# Patient Record
Sex: Female | Born: 1970 | Race: White | Hispanic: No | Marital: Married | Smoking: Never smoker
Health system: Southern US, Community
[De-identification: ages and names within clinical notes are randomized; demographics above are authoritative.]

## PROBLEM LIST (undated history)

## (undated) DIAGNOSIS — N979 Female infertility, unspecified: Secondary | ICD-10-CM

## (undated) DIAGNOSIS — F909 Attention-deficit hyperactivity disorder, unspecified type: Secondary | ICD-10-CM

## (undated) HISTORY — PX: FOOT SURGERY: SHX648

---

## 1998-06-12 ENCOUNTER — Inpatient Hospital Stay (HOSPITAL_COMMUNITY): Admission: AD | Admit: 1998-06-12 | Discharge: 1998-06-16 | Payer: Self-pay | Admitting: Obstetrics and Gynecology

## 1998-07-22 ENCOUNTER — Other Ambulatory Visit: Admission: RE | Admit: 1998-07-22 | Discharge: 1998-07-22 | Payer: Self-pay | Admitting: Obstetrics and Gynecology

## 1999-07-30 ENCOUNTER — Other Ambulatory Visit: Admission: RE | Admit: 1999-07-30 | Discharge: 1999-07-30 | Payer: Self-pay | Admitting: Obstetrics and Gynecology

## 2000-08-09 ENCOUNTER — Other Ambulatory Visit: Admission: RE | Admit: 2000-08-09 | Discharge: 2000-08-09 | Payer: Self-pay | Admitting: Obstetrics and Gynecology

## 2001-08-15 ENCOUNTER — Other Ambulatory Visit: Admission: RE | Admit: 2001-08-15 | Discharge: 2001-08-15 | Payer: Self-pay | Admitting: Obstetrics and Gynecology

## 2002-08-20 ENCOUNTER — Other Ambulatory Visit: Admission: RE | Admit: 2002-08-20 | Discharge: 2002-08-20 | Payer: Self-pay | Admitting: Obstetrics and Gynecology

## 2003-04-01 ENCOUNTER — Other Ambulatory Visit: Admission: RE | Admit: 2003-04-01 | Discharge: 2003-04-01 | Payer: Self-pay | Admitting: Obstetrics and Gynecology

## 2003-10-05 HISTORY — PX: TUBAL LIGATION: SHX77

## 2003-10-20 ENCOUNTER — Inpatient Hospital Stay (HOSPITAL_COMMUNITY): Admission: AD | Admit: 2003-10-20 | Discharge: 2003-10-20 | Payer: Self-pay | Admitting: Obstetrics and Gynecology

## 2003-10-26 ENCOUNTER — Encounter (INDEPENDENT_AMBULATORY_CARE_PROVIDER_SITE_OTHER): Payer: Self-pay | Admitting: *Deleted

## 2003-10-26 ENCOUNTER — Inpatient Hospital Stay (HOSPITAL_COMMUNITY): Admission: AD | Admit: 2003-10-26 | Discharge: 2003-10-28 | Payer: Self-pay | Admitting: Obstetrics and Gynecology

## 2003-12-04 ENCOUNTER — Other Ambulatory Visit: Admission: RE | Admit: 2003-12-04 | Discharge: 2003-12-04 | Payer: Self-pay | Admitting: Obstetrics and Gynecology

## 2005-03-18 ENCOUNTER — Other Ambulatory Visit: Admission: RE | Admit: 2005-03-18 | Discharge: 2005-03-18 | Payer: Self-pay | Admitting: Obstetrics and Gynecology

## 2009-10-04 HISTORY — PX: MICROTUBOPLASTY: SHX5401

## 2010-02-25 ENCOUNTER — Ambulatory Visit (HOSPITAL_COMMUNITY): Admission: RE | Admit: 2010-02-25 | Discharge: 2010-02-25 | Payer: Self-pay | Admitting: Specialist

## 2010-05-29 ENCOUNTER — Ambulatory Visit (HOSPITAL_COMMUNITY): Admission: RE | Admit: 2010-05-29 | Discharge: 2010-05-29 | Payer: Self-pay | Admitting: Specialist

## 2010-12-07 ENCOUNTER — Other Ambulatory Visit: Payer: Self-pay | Admitting: Obstetrics and Gynecology

## 2011-02-19 NOTE — Op Note (Signed)
Martha, Clayton                          ACCOUNT NO.:  [id number]   MEDICAL RECORD NO.:  [id number]                   PATIENT TYPE:  INP   LOCATION:  9108                                 FACILITY:  [facility]   PHYSICIAN:  Dineen Kid. Rana Snare, M.D.                 DATE OF BIRTH:  1970/12/25   DATE OF PROCEDURE:  10/26/2003  DATE OF DISCHARGE:                                 OPERATIVE REPORT   PREOPERATIVE DIAGNOSIS:  Multiparity and desires sterility.   POSTOPERATIVE DIAGNOSIS:  Multiparity and desires sterility.   PROCEDURE:  Modified [place] bilateral tubal ligation.   SURGEON:  Dineen Kid. Rana Snare, M.D.   ANESTHESIA:  Epidural.   INDICATIONS:  [name] is a 40 year old, G2, P2, status post vaginal  delivery of her second child without complications.  She and her husband  adamantly desire sterilization.  Risks and benefits were discussed at length  which include but not limited to risk of infection; bleeding; damage to  uterus, tubes, ovaries, bowel, bladder, risk of tubal failure quoted as  5:1000 failure rate.  She gives her informed consent.   DESCRIPTION OF PROCEDURE:  After adequate analgesia, the patient was placed  in the supine position.  She is sterilely prepped and draped.  A 1.5 cm  infraumbilical skin incision was made.  This was taken down sharply to the  fascia which is entered sharply.  Peritoneum is also entered sharply and  retracted laterally with [employer] retractors.  The left fallopian tube was  identified, grasped with a Babcock clamp, confirmed by the fimbriated end.  A mid portion of the tube was grasped, doubly ligated with 0 plain suture.  The proximal portion was then ligated with 2-0 silk and the central portion  excised.  It was noted to be hemostatic and re-placed back in the peritoneal  cavity.  The right fallopian tube was grasped, confirmed by the fimbriated  end, and the end portion of the tube was doubly ligated with 0 plain suture  x 2.  The proximal  section was then ligated with 2-0 silk and the central  portion excised with good hemostasis noted.  It was returned back to the  peritoneal cavity.  The fascia was then closed with 0 Vicryl in a running  fashion.  Skin was then closed with 3-0 Vicryl Rapide in subcuticular  fashion.  The incision was injected with 0.25% Marcaine 5 mL used.  The  patient tolerated the procedure well, was stable on transfer to the recovery  room.  Sponge, needle, and instrument counts were correct was normal x 3.  The estimated blood loss was less than 5 mL.  Dineen Kid Rana Snare, M.D.    DCL/MEDQ  D:  10/26/2003  T:  10/26/2003  Job:  [id number]

## 2011-09-03 ENCOUNTER — Encounter (HOSPITAL_COMMUNITY): Payer: Self-pay | Admitting: Pharmacy Technician

## 2011-09-04 ENCOUNTER — Other Ambulatory Visit: Payer: Self-pay | Admitting: Obstetrics and Gynecology

## 2011-09-04 ENCOUNTER — Ambulatory Visit (HOSPITAL_COMMUNITY)
Admission: RE | Admit: 2011-09-04 | Discharge: 2011-09-04 | Disposition: A | Discharge status: Home / Self Care | Payer: BC Managed Care – PPO | Source: Ambulatory Visit | Attending: Obstetrics and Gynecology | Admitting: Obstetrics and Gynecology

## 2011-09-04 ENCOUNTER — Encounter (HOSPITAL_COMMUNITY)
Admission: RE | Disposition: A | Discharge status: Home / Self Care | Payer: Self-pay | Source: Ambulatory Visit | Attending: Obstetrics and Gynecology

## 2011-09-04 ENCOUNTER — Encounter (HOSPITAL_COMMUNITY): Payer: Self-pay | Admitting: Anesthesiology

## 2011-09-04 ENCOUNTER — Encounter (HOSPITAL_COMMUNITY): Payer: Self-pay | Admitting: *Deleted

## 2011-09-04 ENCOUNTER — Ambulatory Visit (HOSPITAL_COMMUNITY): Payer: BC Managed Care – PPO | Admitting: Anesthesiology

## 2011-09-04 DIAGNOSIS — O021 Missed abortion: Secondary | ICD-10-CM

## 2011-09-04 HISTORY — PX: DILATION AND EVACUATION: SHX1459

## 2011-09-04 LAB — CBC
Hemoglobin: 11.1 g/dL — ABNORMAL LOW (ref 12.0–15.0)
MCV: 96.2 fL (ref 78.0–100.0)
Platelets: 226 10*3/uL (ref 150–400)

## 2011-09-04 SURGERY — DILATION AND EVACUATION, UTERUS
Anesthesia: Monitor Anesthesia Care

## 2011-09-04 MED ORDER — DEXAMETHASONE SODIUM PHOSPHATE 10 MG/ML IJ SOLN
INTRAMUSCULAR | Status: DC | PRN
  Administered 2011-09-04: 10 mg via INTRAVENOUS

## 2011-09-04 MED ORDER — FENTANYL CITRATE 0.05 MG/ML IJ SOLN
INTRAMUSCULAR | Status: DC | PRN
  Administered 2011-09-04: 50 ug via INTRAVENOUS
  Administered 2011-09-04 (×2): 100 ug via INTRAVENOUS

## 2011-09-04 MED ORDER — FENTANYL CITRATE 0.05 MG/ML IJ SOLN
INTRAMUSCULAR | Status: AC
  Filled 2011-09-04: qty 5

## 2011-09-04 MED ORDER — PROPOFOL 10 MG/ML IV EMUL
INTRAVENOUS | Status: DC | PRN
  Administered 2011-09-04: 50 mg via INTRAVENOUS

## 2011-09-04 MED ORDER — METHYLERGONOVINE MALEATE 0.2 MG PO TABS: 0.2000 mg | ORAL_TABLET | Freq: Three times a day (TID) | ORAL | Status: AC

## 2011-09-04 MED ORDER — ONDANSETRON HCL 4 MG/2ML IJ SOLN
INTRAMUSCULAR | Status: DC | PRN
  Administered 2011-09-04: 4 mg via INTRAVENOUS

## 2011-09-04 MED ORDER — LIDOCAINE HCL (CARDIAC) 20 MG/ML IV SOLN
INTRAVENOUS | Status: AC
  Filled 2011-09-04: qty 5

## 2011-09-04 MED ORDER — KETOROLAC TROMETHAMINE 30 MG/ML IJ SOLN
INTRAMUSCULAR | Status: AC
  Filled 2011-09-04: qty 1

## 2011-09-04 MED ORDER — LACTATED RINGERS IV SOLN
INTRAVENOUS | Status: DC
  Administered 2011-09-04: 08:00:00 via INTRAVENOUS

## 2011-09-04 MED ORDER — METHYLERGONOVINE MALEATE 0.2 MG/ML IJ SOLN
INTRAMUSCULAR | Status: AC
  Filled 2011-09-04: qty 1

## 2011-09-04 MED ORDER — CEFOTETAN DISODIUM-DEXTROSE 1-3.58 GM-% IV SOLR
1.0000 g | INTRAVENOUS | Status: AC
  Administered 2011-09-04: 1 g via INTRAVENOUS
  Filled 2011-09-04: qty 50

## 2011-09-04 MED ORDER — PROPOFOL 10 MG/ML IV EMUL
INTRAVENOUS | Status: AC
  Filled 2011-09-04: qty 20

## 2011-09-04 MED ORDER — METHYLERGONOVINE MALEATE 0.2 MG/ML IJ SOLN
INTRAMUSCULAR | Status: DC | PRN
  Administered 2011-09-04: 0.2 mg via INTRAMUSCULAR

## 2011-09-04 MED ORDER — MIDAZOLAM HCL 2 MG/2ML IJ SOLN
INTRAMUSCULAR | Status: AC
  Filled 2011-09-04: qty 2

## 2011-09-04 MED ORDER — ONDANSETRON HCL 4 MG/2ML IJ SOLN
INTRAMUSCULAR | Status: AC
  Filled 2011-09-04: qty 2

## 2011-09-04 MED ORDER — KETOROLAC TROMETHAMINE 30 MG/ML IJ SOLN
INTRAMUSCULAR | Status: DC | PRN
  Administered 2011-09-04: 30 mg via INTRAVENOUS

## 2011-09-04 MED ORDER — MIDAZOLAM HCL 5 MG/5ML IJ SOLN
INTRAMUSCULAR | Status: DC | PRN
  Administered 2011-09-04: 2 mg via INTRAVENOUS

## 2011-09-04 MED ORDER — DEXAMETHASONE SODIUM PHOSPHATE 10 MG/ML IJ SOLN
INTRAMUSCULAR | Status: AC
  Filled 2011-09-04: qty 1

## 2011-09-04 MED ORDER — LIDOCAINE HCL (CARDIAC) 20 MG/ML IV SOLN
INTRAVENOUS | Status: DC | PRN
  Administered 2011-09-04: 40 mg via INTRAVENOUS

## 2011-09-04 MED ORDER — LIDOCAINE-EPINEPHRINE 1 %-1:100000 IJ SOLN
INTRAMUSCULAR | Status: DC | PRN
  Administered 2011-09-04: 20 mL via INTRADERMAL

## 2011-09-04 SURGICAL SUPPLY — 19 items
CATH ROBINSON RED A/P 16FR (CATHETERS) ×2 IMPLANT
CLOTH BEACON ORANGE TIMEOUT ST (SAFETY) ×2 IMPLANT
DECANTER SPIKE VIAL GLASS SM (MISCELLANEOUS) ×2 IMPLANT
GLOVE BIO SURGEON STRL SZ8 (GLOVE) ×2 IMPLANT
GLOVE SURG ORTHO 8.0 STRL STRW (GLOVE) ×2 IMPLANT
GLOVE SURG SS PI 7.5 STRL IVOR (GLOVE) ×6 IMPLANT
GOWN PREVENTION PLUS LG XLONG (DISPOSABLE) ×2 IMPLANT
KIT BERKELEY 1ST TRIMESTER 3/8 (MISCELLANEOUS) ×2 IMPLANT
NEEDLE SPNL 22GX3.5 QUINCKE BK (NEEDLE) ×2 IMPLANT
NS IRRIG 1000ML POUR BTL (IV SOLUTION) ×2 IMPLANT
PACK VAGINAL MINOR WOMEN LF (CUSTOM PROCEDURE TRAY) ×2 IMPLANT
PAD PREP 24X48 CUFFED NSTRL (MISCELLANEOUS) ×2 IMPLANT
SET BERKELEY SUCTION TUBING (SUCTIONS) ×2 IMPLANT
SYR CONTROL 10ML LL (SYRINGE) ×2 IMPLANT
TOWEL OR 17X24 6PK STRL BLUE (TOWEL DISPOSABLE) ×4 IMPLANT
VACURETTE 10 RIGID CVD (CANNULA) ×2 IMPLANT
VACURETTE 7MM CVD STRL WRAP (CANNULA) IMPLANT
VACURETTE 8 RIGID CVD (CANNULA) IMPLANT
VACURETTE 9 RIGID CVD (CANNULA) IMPLANT

## 2011-09-04 NOTE — Op Note (Signed)
NAMEDAVANEE, KLINKNER                   ACCOUNT NO.:  [id number]  MEDICAL RECORD NO.:  [id number]  LOCATION:  [facility]                          FACILITY:  [facility]  PHYSICIAN:  Dineen Kid. Rana Snare, M.D.    DATE OF BIRTH:  13-Apr-1971  DATE OF PROCEDURE:  09/04/2011 DATE OF DISCHARGE:  09/04/2011                              OPERATIVE REPORT   PREOPERATIVE DIAGNOSIS:  Intrauterine pregnancy at 10+ gestational weeks with embryonic demise.  POSTOPERATIVE DIAGNOSIS:  Intrauterine pregnancy at 10+ gestational weeks with embryonic demise.  PROCEDURE:  Dilation and evacuation.  SURGEON:  Dineen Kid. Rana Snare, MD  ANESTHESIA:  Monitored anesthetic care and paracervical block.  INDICATIONS:  Ms. Netherland is a 40 year old, she is a G4, P2, A1, who was seen in the office yesterday for a routine OB visit with pregnancy is a product of in vitro fertilization.  Yesterday on the evening, I hear her fetal heart tone and ultrasound evaluation shows a 10-week embryonic demise.  She desires dilation and evacuation.  Risks and benefits were discussed.  Informed consent was obtained.  She is Rh positive.  DESCRIPTION OF PROCEDURE:  After adequate analgesia, the patient was placed in the dorsal lithotomy position.  She was sterilely prepped and draped.  Bladder was sterilely drained.  Graves speculum was placed. Tenaculum was placed in the anterior lip of the cervix.  Paracervical block was placed with 1% Xylocaine 1:100,000 epinephrine, total of 20 mL used.  Uterus was sounded to 11 cm, easily dilated to #31 [facility] dilator. A 10-mm suction curette was inserted.  Products of conception retrieved. Suction curettage was performed until all the tissue was removed from the endometrial cavity.  The patient was then given Methergine 0.2 mg IM with good uterine response.  Gritty surface was felt throughout the endometrial cavity and good uterine response was noted with minimal bleeding or tissue and at this time, the curette was then  removed.  The tenaculum was removed from the anterior lip of the cervix.  The speculum was removed.  The patient was transferred to the recovery room in stable condition.  Sponge and instrument count was normal x3.  Estimated blood loss was minimal.  The patient received 1 gram of cefotetan preoperatively, 0.2 mg of Methergine intraoperatively, and 30 mg of Toradol intravenously intraoperatively.  DISPOSITION:  The patient will be discharged to home and follow up in the office in 2-3 weeks.  Sent her with routine instruction sheet for D and E, also Methergine 0.2 mg take 3 times a day for 2 days.  Told to return for increased pain, fever, or bleeding.     Dineen Kid Rana Snare, M.D.     DCL/MEDQ  D:  09/04/2011  T:  09/04/2011  Job:  [id number]

## 2011-09-04 NOTE — Op Note (Signed)
Dictated Op Note D&E for Embryonic demise 10 weeks Para Cx, MAC Garhett Bernhard No complications DL

## 2011-09-04 NOTE — Progress Notes (Signed)
Pt has perip-pad with scant blood noted. Will continue to monitor.

## 2011-09-04 NOTE — Transfer of Care (Signed)
Immediate Anesthesia Transfer of Care Note  Patient: Martha Clayton  Procedure(s) Performed:  DILATATION AND EVACUATION (D&E)  Patient Location: PACU  Anesthesia Type: MAC  Level of Consciousness: oriented and sedated  Airway & Oxygen Therapy: Patient Spontanous Breathing  Post-op Assessment: Report given to PACU RN and Post -op Vital signs reviewed and stable  Post vital signs: stable  Complications: No apparent anesthesia complications

## 2011-09-04 NOTE — H&P (Signed)
Martha Clayton is an 40 y.o. female. Who presents for D&E due to embryonic demise.  Seen yesterday in office for routine new ob visit U/S confirms 10week  Embryonic demise.  This was an IVF pregnancy and otherwise uncomplicated  Pertinent Gynecological History: Menses:  Bleeding: Contraception: none DES exposure: denies Blood transfusions: none Sexually transmitted diseases: no past history Previous GYN Procedures:   Last mammogram: normal Date:  Last pap: normal Date:  OB History: G4, P2   Menstrual History: Menarche age:  No LMP recorded.    History reviewed. No pertinent past medical history.  Past Surgical History  Procedure Date  . Foot surgery 2010, 2006  . Microtuboplasty 2011  . Tubal ligation 2005    History reviewed. No pertinent family history.  Social History:  does not have a smoking history on file. She does not have any smokeless tobacco history on file. She reports that she does not drink alcohol or use illicit drugs.  Allergies: No Known Allergies  Prescriptions prior to admission  Medication Sig Dispense Refill  . docusate sodium (COLACE) 100 MG capsule Take 100 mg by mouth daily as needed. bm       . prenatal vitamin w/FE, FA (PRENATAL 1 + 1) 27-1 MG TABS Take 1 tablet by mouth daily.          ROS  Blood pressure 105/66, pulse 69, temperature 99 F (37.2 C), temperature source Oral, resp. rate 18, height 5\' 7"  (1.702 m), weight 67.132 kg (148 lb), SpO2 100.00%. Physical Exam Uterus is 10 weeks size Cx Closed and thick Results for orders placed during the hospital encounter of 09/04/11 (from the past 24 hour(s))  CBC     Status: Abnormal   Collection Time   09/04/11  8:00 AM      Component Value Range   WBC 7.6  4.0 - 10.5 (K/uL)   RBC 3.38 (*) 3.87 - 5.11 (MIL/uL)   Hemoglobin 11.1 (*) 12.0 - 15.0 (g/dL)   HCT 16.1 (*) 09.6 - 46.0 (%)   MCV 96.2  78.0 - 100.0 (fL)   MCH 32.8  26.0 - 34.0 (pg)   MCHC 34.2  30.0 - 36.0 (g/dL)   RDW 04.5  40.9 -  81.1 (%)   Platelets 226  150 - 400 (K/uL)  ABO/RH     Status: Normal   Collection Time   09/04/11  8:00 AM      Component Value Range   ABO/RH(D) O POS      No results found.  Assessment/Plan: IUP at 10 weeks with embryonic demise Pt elects for D&E.  Risks and benefits were discussed and informed consent obtained.   Maliq Pilley C 09/04/2011, 9:03 AM

## 2011-09-04 NOTE — Anesthesia Postprocedure Evaluation (Signed)
Anesthesia Post Note  Patient: Martha Clayton  Procedure(s) Performed:  DILATATION AND EVACUATION (D&E)  Anesthesia type: MAC  Patient location: PACU  Post pain: Pain level controlled  Post assessment: Post-op Vital signs reviewed  Last Vitals:  Filed Vitals:   09/04/11 1230  BP: 104/60  Pulse: 80  Temp: 36.4 C  Resp: 18    Post vital signs: Reviewed  Level of consciousness: sedated  Complications: No apparent anesthesia complications

## 2011-09-04 NOTE — Anesthesia Preprocedure Evaluation (Signed)
Anesthesia Evaluation  Patient identified by MRN, date of birth, ID band Patient awake    Reviewed: Allergy & Precautions, H&P , NPO status , Patient's Chart, lab work & pertinent test results, reviewed documented beta blocker date and time   History of Anesthesia Complications Negative for: history of anesthetic complications  Airway Mallampati: III TM Distance: >3 FB Neck ROM: full    Dental  (+) Teeth Intact   Pulmonary neg pulmonary ROS,  clear to auscultation  Pulmonary exam normal       Cardiovascular neg cardio ROS regular Normal    Neuro/Psych Negative Neurological ROS  Negative Psych ROS   GI/Hepatic negative GI ROS, Neg liver ROS,   Endo/Other  Negative Endocrine ROS  Renal/GU negative Renal ROS  Genitourinary negative   Musculoskeletal   Abdominal   Peds  Hematology negative hematology ROS (+)   Anesthesia Other Findings   Reproductive/Obstetrics (+) Pregnancy (10 wk IVF pregnancy - missed ab)                           Anesthesia Physical Anesthesia Plan  ASA: II  Anesthesia Plan: MAC   Post-op Pain Management:    Induction:   Airway Management Planned:   Additional Equipment:   Intra-op Plan:   Post-operative Plan:   Informed Consent: I have reviewed the patients History and Physical, chart, labs and discussed the procedure including the risks, benefits and alternatives for the proposed anesthesia with the patient or authorized representative who has indicated his/her understanding and acceptance.   Dental Advisory Given  Plan Discussed with: CRNA and Surgeon  Anesthesia Plan Comments:         Anesthesia Quick Evaluation

## 2011-09-07 ENCOUNTER — Encounter (HOSPITAL_COMMUNITY): Payer: Self-pay | Admitting: Obstetrics and Gynecology

## 2012-03-14 ENCOUNTER — Emergency Department
Admit: 2012-03-14 | Discharge: 2012-03-14 | Disposition: A | Discharge status: Home / Self Care | Payer: BC Managed Care – PPO

## 2012-03-14 ENCOUNTER — Encounter: Payer: Self-pay | Admitting: Emergency Medicine

## 2012-03-14 ENCOUNTER — Emergency Department
Admission: EM | Admit: 2012-03-14 | Discharge: 2012-03-14 | Disposition: A | Discharge status: Home / Self Care | Payer: BC Managed Care – PPO | Source: Home / Self Care | Attending: Family Medicine | Admitting: Family Medicine

## 2012-03-14 DIAGNOSIS — S93409A Sprain of unspecified ligament of unspecified ankle, initial encounter: Secondary | ICD-10-CM

## 2012-03-14 DIAGNOSIS — S93401A Sprain of unspecified ligament of right ankle, initial encounter: Secondary | ICD-10-CM

## 2012-03-14 HISTORY — DX: Female infertility, unspecified: N97.9

## 2012-03-14 HISTORY — DX: Attention-deficit hyperactivity disorder, unspecified type: F90.9

## 2012-03-14 NOTE — ED Notes (Signed)
Missed a step and rolled right ankle 3 days ago; seemed to be improving, then regressed to more pain and edema today.

## 2012-03-14 NOTE — Discharge Instructions (Signed)
Apply ice pack for 30 minutes, 3 to 4 times daily until swelling decreases.  Elevate.  Use crutches for 3 to 5 days.  Wear Ace wrap until swelling decreases.  Wear brace for about 2 to 3 weeks.  Begin range of motion and stretching exercises in about 5 days as per instruction sheet.   Ankle Sprain An ankle sprain is an injury to the strong, fibrous tissues (ligaments) that hold the bones of your ankle joint together.  CAUSES Ankle sprain usually is caused by a fall or by twisting your ankle. People who participate in sports are more prone to these types of injuries.  SYMPTOMS  Symptoms of ankle sprain include:  Pain in your ankle. The pain may be present at rest or only when you are trying to stand or walk.   Swelling.   Bruising. Bruising may develop immediately or within 1 to 2 days after your injury.   Difficulty standing or walking.  DIAGNOSIS  Your caregiver will ask you details about your injury and perform a physical exam of your ankle to determine if you have an ankle sprain. During the physical exam, your caregiver will press and squeeze specific areas of your foot and ankle. Your caregiver will try to move your ankle in certain ways. An X-ray exam may be done to be sure a bone was not broken or a ligament did not separate from one of the bones in your ankle (avulsion).  TREATMENT  Certain types of braces can help stabilize your ankle. Your caregiver can make a recommendation for this. Your caregiver may recommend the use of medication for pain. If your sprain is severe, your caregiver may refer you to a surgeon who helps to restore function to parts of your skeletal system (orthopedist) or a physical therapist. HOME CARE INSTRUCTIONS  Apply ice to your injury for 1 to 2 days or as directed by your caregiver. Applying ice helps to reduce inflammation and pain.  Put ice in a plastic bag.   Place a towel between your skin and the bag.   Leave the ice on for 15 to 20 minutes at a  time, every 2 hours while you are awake.   Take over-the-counter or prescription medicines for pain, discomfort, or fever only as directed by your caregiver.   Keep your injured leg elevated, when possible, to lessen swelling.   If your caregiver recommends crutches, use them as instructed. Gradually, put weight on the affected ankle. Continue to use crutches or a cane until you can walk without feeling pain in your ankle.   If you have a plaster splint, wear the splint as directed by your caregiver. Do not rest it on anything harder than a pillow the first 24 hours. Do not put weight on it. Do not get it wet. You may take it off to take a shower or bath.   You may have been given an elastic bandage to wear around your ankle to provide support. If the elastic bandage is too tight (you have numbness or tingling in your foot or your foot becomes cold and blue), adjust the bandage to make it comfortable.   If you have an air splint, you may blow more air into it or let air out to make it more comfortable. You may take your splint off at night and before taking a shower or bath.   Wiggle your toes in the splint several times per day if you are able.  SEEK MEDICAL CARE IF:  You have an increase in bruising, swelling, or pain.   Your toes feel cold.   Pain relief is not achieved with medication.  SEEK IMMEDIATE MEDICAL CARE IF: Your toes are numb or blue or you have severe pain. MAKE SURE YOU:   Understand these instructions.   Will watch your condition.   Will get help right away if you are not doing well or get worse.  Document Released: 09/20/2005 Document Revised: 09/09/2011 Document Reviewed: 04/24/2008 [facility] Patient Information 2012 [place], [place].

## 2012-03-14 NOTE — ED Provider Notes (Signed)
History     CSN: [id number]  Arrival date & time 03/14/12  1749   First MD Initiated Contact with Patient 03/14/12 1804      Chief Complaint  Patient presents with  . Ankle Pain     HPI Comments: Patient inverted her right ankle and felt a popping sensation laterally two days ago.  She has had persistent pain/swelling with weight bearing.  Patient is a 41 y.o. female presenting with ankle pain. The history is provided by the patient.  Ankle Pain This is a new problem. The current episode started 2 days ago. The problem occurs constantly. The problem has not changed since onset.Associated symptoms comments: none. The symptoms are aggravated by walking and standing. The symptoms are relieved by nothing. Treatments tried: ice. The treatment provided no relief.    Past medical history:  ADHD  Past Surgical History  Procedure Date  . Foot surgery 2010, 2006  . Microtuboplasty 2011  . Tubal ligation 2005  . Dilation and evacuation 09/04/2011    Procedure: DILATATION AND EVACUATION (D&E);  Surgeon: Turner Daniels, MD;  Location: [facility];  Service: Gynecology;  Laterality: N/A;    Family history:  Hypertension (mother and father)  History  Substance Use Topics  . Smoking status: No   . Smokeless tobacco: No   . Alcohol Use: No    OB History    Grav Para Term Preterm Abortions TAB SAB Ect Mult Living                  Review of Systems  All other systems reviewed and are negative.    Allergies  Review of patient's allergies indicates no known allergies.  Home Medications   Current Outpatient Rx  Name Route Sig Dispense Refill  . DOCUSATE SODIUM 100 MG PO CAPS Oral Take 100 mg by mouth daily as needed. bm     . METHYLERGONOVINE MALEATE 0.2 MG PO TABS Oral Take 1 tablet (0.2 mg total) by mouth 3 (three) times daily. 6 tablet 0  . PRENATAL PLUS 27-1 MG PO TABS Oral Take 1 tablet by mouth daily.        BP 111/73  Pulse 67  Temp(Src) 98.1 F (36.7 C) (Oral)  Resp 16   Ht 5\' 7"  (1.702 m)  Wt 145 lb (65.772 kg)  BMI 22.71 kg/m2  SpO2 100%  LMP 02/06/2012  Physical Exam  Nursing note and vitals reviewed. Constitutional: She is oriented to person, place, and time. She appears well-developed and well-nourished. No distress.  HENT:  Head: Atraumatic.  Eyes: Conjunctivae are normal. Pupils are equal, round, and reactive to light.  Musculoskeletal: She exhibits tenderness.       Right ankle: She exhibits decreased range of motion and swelling. She exhibits no ecchymosis, no deformity, no laceration and normal pulse. tenderness. Lateral malleolus, CF ligament, posterior TFL and proximal fibula tenderness found. No medial malleolus, no AITFL and no head of 5th metatarsal tenderness found. Achilles tendon normal.       Feet:       Tenderness noted on diagram.  Distal neurovascular function is intact.   Neurological: She is alert and oriented to person, place, and time.  Skin: Skin is warm and dry. No erythema.    ED Course  Procedures  none   Dg Ankle Complete Right  03/14/2012  *RADIOLOGY REPORT*  Clinical Data: Inverted ankle 2 days ago with pain and swelling laterally  RIGHT ANKLE - COMPLETE 3+ VIEW  Comparison: None.  Findings: The ankle joint appears normal.  Alignment is normal.  No fracture is seen.  IMPRESSION: Negative.  Original Report Authenticated By: Juline Patch, M.D.     1. Right ankle sprain       MDM  Ace wrap applied and crutches dispensed.  Also dispensed AirCast splint.  May continue Tylenol for pain.  Apply ice pack for 30 minutes, 3 to 4 times daily until swelling decreases.  Elevate.  Use crutches for 3 to 5 days.  Wear Ace wrap until swelling decreases.  Wear brace for about 2 to 3 weeks.  Begin range of motion and stretching exercises in about 5 days as per instruction sheet ([facility] information and instruction handout given)  Followup with [facility] if not improving about two weeks.         Lattie Haw, MD 03/14/12 1901

## 2012-06-21 ENCOUNTER — Other Ambulatory Visit: Payer: Self-pay | Admitting: Obstetrics and Gynecology

## 2014-02-12 ENCOUNTER — Emergency Department
Admission: EM | Admit: 2014-02-12 | Discharge: 2014-02-12 | Disposition: A | Discharge status: Home / Self Care | Payer: BC Managed Care – PPO | Source: Home / Self Care | Attending: Emergency Medicine | Admitting: Emergency Medicine

## 2014-02-12 ENCOUNTER — Encounter: Payer: Self-pay | Admitting: Emergency Medicine

## 2014-02-12 DIAGNOSIS — J029 Acute pharyngitis, unspecified: Secondary | ICD-10-CM

## 2014-02-12 LAB — POCT RAPID STREP A (OFFICE): Rapid Strep A Screen: NEGATIVE

## 2014-02-12 MED ORDER — AMOXICILLIN 875 MG PO TABS: 875.0000 mg | ORAL_TABLET | Freq: Two times a day (BID) | ORAL | Status: DC

## 2014-02-12 NOTE — ED Provider Notes (Signed)
CSN: [id number]     Arrival date & time 02/12/14  1609 History   First MD Initiated Contact with Patient 02/12/14 1610     Chief Complaint  Patient presents with  . Sore Throat  . Headache   (Consider location/radiation/quality/duration/timing/severity/associated sxs/prior Treatment) HPI Huntley DecSara is a 43 y.o. female who complains of onset of cold symptoms for 4-5 days.  The symptoms are constant and mild-moderate in severity.  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 + sore throat No cough No pleuritic pain No wheezing + nasal congestion +  post-nasal drainage + R sided sinus pain/pressure No chest congestion No itchy/red eyes No earache No hemoptysis No SOB + chills/sweats No nausea No vomiting No abdominal pain No diarrhea No skin rashes No fatigue No myalgias + headache     Past Medical History  Diagnosis Date  . ADHD (attention deficit hyperactivity disorder)   . Infertility, female    Past Surgical History  Procedure Laterality Date  . Foot surgery  2010, 2006  . Microtuboplasty  2011  . Tubal ligation  2005  . Dilation and evacuation  09/04/2011    Procedure: DILATATION AND EVACUATION (D&E);  Surgeon: Turner Danielsavid C Lowe, MD;  Location: [facility];  Service: Gynecology;  Laterality: N/A;   Family History  Problem Relation Age of Onset  . Hypertension Mother   . Hypertension Father    History  Substance Use Topics  . Smoking status: Never Smoker   . Smokeless tobacco: Never Used  . Alcohol Use: No   OB History   Grav Para Term Preterm Abortions TAB SAB Ect Mult Living                 Review of Systems  All other systems reviewed and are negative.   Allergies  Review of patient's allergies indicates no known allergies.  Home Medications   Prior to Admission medications   Medication Sig Start Date End Date Taking? Authorizing Provider  docusate sodium (COLACE) 100 MG capsule Take 100 mg by mouth daily as needed. bm     Historical  Provider, MD  prenatal vitamin w/FE, FA (PRENATAL 1 + 1) 27-1 MG TABS Take 1 tablet by mouth daily.      Historical Provider, MD   BP 122/88  Pulse 90  Temp(Src) 98.4 F (36.9 C) (Oral)  Resp 14  Wt 158 lb (71.668 kg)  SpO2 99%  LMP 02/10/2014 Physical Exam  Nursing note and vitals reviewed. Constitutional: She is oriented to person, place, and time. She appears well-developed and well-nourished.  HENT:  Head: Normocephalic and atraumatic.  Right Ear: Tympanic membrane, external ear and ear canal normal.  Left Ear: Tympanic membrane, external ear and ear canal normal.  Nose: Mucosal edema and rhinorrhea present.  Mouth/Throat: Posterior oropharyngeal erythema present. No oropharyngeal exudate or posterior oropharyngeal edema.  Eyes: No scleral icterus.  Neck: Neck supple.  Cardiovascular: Regular rhythm and normal heart sounds.   Pulmonary/Chest: Effort normal and breath sounds normal. No respiratory distress. She has no decreased breath sounds. She has no wheezes.  Neurological: She is alert and oriented to person, place, and time.  Skin: Skin is warm and dry.  Psychiatric: She has a normal mood and affect. Her speech is normal.    ED Course  Procedures (including critical care time) Labs Review Labs Reviewed  STREP A DNA PROBE  POCT RAPID STREP A (OFFICE)    Imaging Review No results found.   MDM  1. Acute pharyngitis    1)  Rapid strep neg, culture pending.  Rx for Amox given. 2)  Use nasal saline solution (over the counter) at least 3 times a day. 3)  Use over the counter decongestants like Zyrtec-D every 12 hours as needed to help with congestion.  If you have hypertension, do not take medicines with sudafed.  4)  Can take tylenol every 6 hours or motrin every 8 hours for pain or fever. 5)  Follow up with your primary doctor if no improvement in 5-7 days, sooner if increasing pain, fever, or new symptoms.     Marlaine HindJeffrey H Mirza Kidney, MD 02/12/14 [phone]

## 2014-02-12 NOTE — ED Notes (Signed)
Huntley DecSara c/o allergies x several weeks. X 4 days she c/o HA, sore throat, congestion. 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

## 2014-02-13 LAB — STREP A DNA PROBE: GASP: NEGATIVE

## 2014-02-14 ENCOUNTER — Telehealth: Payer: Self-pay

## 2014-02-14 NOTE — ED Notes (Addendum)
Left a message on voice mail asking how patient is feeling and advising to call back with any questions or concerns. Left message stating throat culture was negative.

## 2017-08-15 ENCOUNTER — Other Ambulatory Visit: Payer: Self-pay

## 2017-08-15 ENCOUNTER — Emergency Department (INDEPENDENT_AMBULATORY_CARE_PROVIDER_SITE_OTHER): Payer: 59

## 2017-08-15 ENCOUNTER — Encounter: Payer: Self-pay | Admitting: Emergency Medicine

## 2017-08-15 ENCOUNTER — Emergency Department (INDEPENDENT_AMBULATORY_CARE_PROVIDER_SITE_OTHER)
Admission: EM | Admit: 2017-08-15 | Discharge: 2017-08-15 | Disposition: A | Discharge status: Home / Self Care | Payer: 59 | Source: Home / Self Care | Attending: Family Medicine | Admitting: Family Medicine

## 2017-08-15 DIAGNOSIS — R0981 Nasal congestion: Secondary | ICD-10-CM

## 2017-08-15 DIAGNOSIS — J069 Acute upper respiratory infection, unspecified: Secondary | ICD-10-CM

## 2017-08-15 DIAGNOSIS — R0989 Other specified symptoms and signs involving the circulatory and respiratory systems: Secondary | ICD-10-CM | POA: Diagnosis not present

## 2017-08-15 DIAGNOSIS — J040 Acute laryngitis: Secondary | ICD-10-CM

## 2017-08-15 DIAGNOSIS — R05 Cough: Secondary | ICD-10-CM

## 2017-08-15 DIAGNOSIS — R509 Fever, unspecified: Secondary | ICD-10-CM

## 2017-08-15 MED ORDER — BENZONATATE 100 MG PO CAPS: 100.0000 mg | ORAL_CAPSULE | Freq: Three times a day (TID) | ORAL | 0 refills | Status: DC

## 2017-08-15 MED ORDER — AZITHROMYCIN 250 MG PO TABS: 250.0000 mg | ORAL_TABLET | Freq: Every day | ORAL | 0 refills | Status: DC

## 2017-08-15 MED ORDER — PREDNISONE 20 MG PO TABS: ORAL_TABLET | ORAL | 0 refills | Status: DC

## 2017-08-15 MED ORDER — ALBUTEROL SULFATE HFA 108 (90 BASE) MCG/ACT IN AERS: 1.0000 | INHALATION_SPRAY | Freq: Four times a day (QID) | RESPIRATORY_TRACT | 0 refills | Status: DC | PRN

## 2017-08-15 NOTE — ED Triage Notes (Signed)
Patient has had URI symptoms for 2 weeks; currently has child with pneumonia; reports cough that hurst in her chest, and some dyspnea.

## 2017-08-15 NOTE — ED Provider Notes (Signed)
[facility]    CSN: [id number] Arrival date & time: 08/15/17  1552     History   Chief Complaint Chief Complaint  Patient presents with  . Nasal Congestion  . Cough    HPI Rosine AbeSara H Seabrooks is a 46 y.o. female.   HPI  Rosine AbeSara H Hardiman is a 46 y.o. female presenting to UC with c/o 2 weeks of URI symptoms that have gradually improved, however, her cough has persisted.  She reports a fever Tmax 102*F last week, which has since resolved.  She also has a hoarse voice, sore throat and sinus congestion with mild chest tightness/pain with cough but no facial pain or pressure. No n/v/d. Her daughter was dx with pneumonia recently and started on antibiotics. Daughter became sick 1 week prior to pt and has hx of asthma.  Pt denies hx of asthma or COPD.   Past Medical History:  Diagnosis Date  . ADHD (attention deficit hyperactivity disorder)   . Infertility, female     There are no active problems to display for this patient.   Past Surgical History:  Procedure Laterality Date  . FOOT SURGERY  2010, 2006  . MICROTUBOPLASTY  2011  . TUBAL LIGATION  2005    OB History    No data available       Home Medications    Prior to Admission medications   Medication Sig Start Date End Date Taking? Authorizing Provider  lisdexamfetamine (VYVANSE) 30 MG capsule Take 30 mg daily by mouth.   Yes [provider]  topiramate (TOPAMAX) 50 MG tablet Take 50 mg daily by mouth.   Yes [provider]  albuterol (PROVENTIL HFA;VENTOLIN HFA) 108 (90 Base) MCG/ACT inhaler Inhale 1-2 puffs every 6 (six) hours as needed into the lungs for wheezing or shortness of breath. 08/15/17   Lurene ShadowPhelps, Amol Domanski O, PA-C  amoxicillin (AMOXIL) 875 MG tablet Take 1 tablet (875 mg total) by mouth 2 (two) times daily. 02/12/14   Marlaine HindHenderson, Jeffrey H, MD  atomoxetine (STRATTERA) 10 MG capsule Take 10 mg by mouth daily.    [provider]  azithromycin (ZITHROMAX) 250 MG tablet Take 1 tablet (250  mg total) daily by mouth. Take first 2 tablets together, then 1 every day until finished. 08/15/17   Lurene ShadowPhelps, Fredrik Mogel O, PA-C  benzonatate (TESSALON) 100 MG capsule Take 1-2 capsules (100-200 mg total) every 8 (eight) hours by mouth. 08/15/17   Myalynn Lingle, Vangie BickerErin O, PA-C  escitalopram (LEXAPRO) 10 MG tablet Take 15 mg by mouth daily.    [provider]  loratadine (CLARITIN) 10 MG tablet Take 10 mg by mouth daily.    [provider]  predniSONE (DELTASONE) 20 MG tablet 3 tabs po day one, then 2 po daily x 4 days 08/15/17   Lurene ShadowPhelps, Daxson Reffett O, PA-C    Family History Family History  Problem Relation Age of Onset  . Hypertension Mother   . Hypertension Father     Social History Social History   Tobacco Use  . Smoking status: Never Smoker  . Smokeless tobacco: Never Used  Substance Use Topics  . Alcohol use: No  . Drug use: No     Allergies   Patient has no known allergies.   Review of Systems Review of Systems  Constitutional: Positive for fatigue. Negative for chills and fever.  HENT: Positive for congestion, postnasal drip, sore throat and voice change. Negative for ear pain and trouble swallowing.   Respiratory: Positive for cough and chest  tightness. Negative for shortness of breath.   Cardiovascular: Negative for chest pain and palpitations.  Gastrointestinal: Negative for abdominal pain, diarrhea, nausea and vomiting.  Musculoskeletal: Negative for arthralgias, back pain and myalgias.  Skin: Negative for rash.  Neurological: Negative for dizziness, light-headedness and headaches.     Physical Exam Triage Vital Signs ED Triage Vitals  Enc Vitals Group     BP 08/15/17 1617 117/75     Pulse Rate 08/15/17 1617 96     Resp 08/15/17 1617 16     Temp 08/15/17 1617 98.4 F (36.9 C)     Temp Source 08/15/17 1617 Oral     SpO2 08/15/17 1617 100 %     Weight 08/15/17 1618 150 lb (68 kg)     Height 08/15/17 1618 5\' 7"  (1.702 m)     Head Circumference --      Peak  Flow --      Pain Score --      Pain Loc --      Pain Edu? --      Excl. in GC? --    No data found.  Updated Vital Signs BP 117/75 (BP Location: Left Arm)   Pulse 96   Temp 98.4 F (36.9 C) (Oral)   Resp 16   Ht 5\' 7"  (1.702 m)   Wt 150 lb (68 kg)   LMP 07/15/2017 (Approximate)   SpO2 100%   BMI 23.49 kg/m   Visual Acuity Right Eye Distance:   Left Eye Distance:   Bilateral Distance:    Right Eye Near:   Left Eye Near:    Bilateral Near:     Physical Exam  Constitutional: She is oriented to person, place, and time. She appears well-developed and well-nourished. No distress.  HENT:  Head: Normocephalic and atraumatic.  Right Ear: Tympanic membrane normal.  Left Ear: Tympanic membrane normal.  Nose: Nose normal. Right sinus exhibits no maxillary sinus tenderness and no frontal sinus tenderness. Left sinus exhibits no maxillary sinus tenderness and no frontal sinus tenderness.  Mouth/Throat: Uvula is midline, oropharynx is clear and moist and mucous membranes are normal.  Eyes: EOM are normal.  Neck: Normal range of motion. Neck supple.  Hoarse voice but no stridor  Cardiovascular: Normal rate and regular rhythm.  Pulmonary/Chest: Effort normal and breath sounds normal. No stridor. No respiratory distress. She has no wheezes. She has no rales.  Musculoskeletal: Normal range of motion.  Lymphadenopathy:    She has no cervical adenopathy.  Neurological: She is alert and oriented to person, place, and time.  Skin: Skin is warm. She is not diaphoretic.  Psychiatric: She has a normal mood and affect. Her behavior is normal.  Nursing note and vitals reviewed.    UC Treatments / Results  Labs (all labs ordered are listed, but only abnormal results are displayed) Labs Reviewed - No data to display  EKG  EKG Interpretation None       Radiology Dg Chest 2 View  Result Date: 08/15/2017 CLINICAL DATA:  Cough and chest tightness.  Fever EXAM: CHEST  2 VIEW  COMPARISON:  None. FINDINGS: The heart size and mediastinal contours are within normal limits. Both lungs are clear. The visualized skeletal structures are unremarkable. IMPRESSION: No active cardiopulmonary disease. Electronically Signed   By: Marlan Palauharles  Clark M.D.   On: 08/15/2017 16:51    Procedures Procedures (including critical care time)  Medications Ordered in UC Medications - No data to display   Initial Impression / Assessment  and Plan / UC Course  I have reviewed the triage vital signs and the nursing notes.  Pertinent labs & imaging results that were available during my care of the patient were reviewed by me and considered in my medical decision making (see chart for details).     Pt c/o 2 weeks of cough with fever that resolved last week. Daughter dx with pneumonia last week.  O2 Sat 100% on RA  CXR: no evidence of pneumonia or bronchitis. Encouraged symptomatic treatment at this time.  Prescription to hold with expiration date for azithromycin. Pt to fill if persistent fever develops or not improving in 1 week.  F/u with PCP in 1 week if worsening.    Final Clinical Impressions(s) / UC Diagnoses   Final diagnoses:  Upper respiratory tract infection, unspecified type  Sinus congestion  Laryngitis    ED Discharge Orders        Ordered    azithromycin (ZITHROMAX) 250 MG tablet  Daily    Comments:  Void after 08/28/17   08/15/17 1708    benzonatate (TESSALON) 100 MG capsule  Every 8 hours     08/15/17 1708    predniSONE (DELTASONE) 20 MG tablet     08/15/17 1708    albuterol (PROVENTIL HFA;VENTOLIN HFA) 108 (90 Base) MCG/ACT inhaler  Every 6 hours PRN     08/15/17 1708       Controlled Substance Prescriptions [place] Controlled Substance Registry consulted? Not Applicable   Rolla Plate 08/15/17 1751

## 2017-08-15 NOTE — Discharge Instructions (Signed)
°  Your symptoms are likely due to a virus such as the common cold, however, if you developing worsening chest congestion with shortness of breath, persistent fever (>100.4*F) for 3 days, or symptoms not improving in 4-5 days, you may fill the antibiotic (azithromycin).  If you do fill the antibiotic,  please take antibiotics as prescribed and be sure to complete entire course even if you start to feel better to ensure infection does not come back. ° °You may take 500mg acetaminophen every 4-6 hours or in combination with ibuprofen 400-600mg every 6-8 hours as needed for pain, inflammation, and fever. ° °Be sure to drink at least eight 8oz glasses of water to stay well hydrated and get at least 8 hours of sleep at night, preferably more while sick.  ° ° °

## 2017-11-26 ENCOUNTER — Encounter: Payer: Self-pay | Admitting: Emergency Medicine

## 2017-11-26 ENCOUNTER — Emergency Department (INDEPENDENT_AMBULATORY_CARE_PROVIDER_SITE_OTHER)
Admission: EM | Admit: 2017-11-26 | Discharge: 2017-11-26 | Disposition: A | Discharge status: Home / Self Care | Payer: 59 | Source: Home / Self Care

## 2017-11-26 DIAGNOSIS — R69 Illness, unspecified: Secondary | ICD-10-CM

## 2017-11-26 DIAGNOSIS — J111 Influenza due to unidentified influenza virus with other respiratory manifestations: Secondary | ICD-10-CM

## 2017-11-26 LAB — POCT INFLUENZA A/B
INFLUENZA A, POC: NEGATIVE
Influenza B, POC: NEGATIVE

## 2017-11-26 MED ORDER — OSELTAMIVIR PHOSPHATE 75 MG PO CAPS: 75.0000 mg | ORAL_CAPSULE | Freq: Two times a day (BID) | ORAL | 0 refills | Status: AC

## 2017-11-26 NOTE — ED Triage Notes (Signed)
Patient complaining of chest tightness x 1 day, fatigue feeling, HA, fever, SOB, only cough when taking a deep breath.

## 2017-11-26 NOTE — Discharge Instructions (Signed)
Return if any problems.  See your Physician for recheck in 2-3 days °

## 2017-11-27 NOTE — ED Provider Notes (Signed)
[facility]    CSN: [id number] Arrival date & time: 11/26/17  1133     History   Chief Complaint Chief Complaint  Patient presents with  . URI    HPI Martha Clayton is a 47 y.o. female.   The history is provided by the patient. No language interpreter was used.  URI  Presenting symptoms: congestion, cough and fever   Severity:  Moderate Onset quality:  Gradual Duration:  1 day Timing:  Constant Progression:  Worsening Chronicity:  New Relieved by:  Nothing Worsened by:  Nothing Ineffective treatments:  None tried Associated symptoms: no headaches   Risk factors: no recent illness    Pt complains of flu like symptoms  Past Medical History:  Diagnosis Date  . ADHD (attention deficit hyperactivity disorder)   . Infertility, female     There are no active problems to display for this patient.   Past Surgical History:  Procedure Laterality Date  . DILATION AND EVACUATION  09/04/2011   Procedure: DILATATION AND EVACUATION (D&E);  Surgeon: Turner Daniels, MD;  Location: [facility];  Service: Gynecology;  Laterality: N/A;  . FOOT SURGERY  2010, 2006  . MICROTUBOPLASTY  2011  . TUBAL LIGATION  2005    OB History    No data available       Home Medications    Prior to Admission medications   Medication Sig Start Date End Date Taking? Authorizing Provider  lisdexamfetamine (VYVANSE) 30 MG capsule Take 30 mg daily by mouth.    [provider]  oseltamivir (TAMIFLU) 75 MG capsule Take 1 capsule (75 mg total) by mouth every 12 (twelve) hours. 11/26/17   Elson Areas, PA-C  topiramate (TOPAMAX) 50 MG tablet Take 50 mg daily by mouth.    [provider]    Family History Family History  Problem Relation Age of Onset  . Hypertension Mother   . Hypertension Father     Social History Social History   Tobacco Use  . Smoking status: Never Smoker  . Smokeless tobacco: Never Used  Substance Use Topics  . Alcohol use: No  . Drug use: No      Allergies   Patient has no known allergies.   Review of Systems Review of Systems  Constitutional: Positive for fever.  HENT: Positive for congestion.   Respiratory: Positive for cough.   Neurological: Negative for headaches.  All other systems reviewed and are negative.    Physical Exam Triage Vital Signs ED Triage Vitals [11/26/17 1223]  Enc Vitals Group     BP 96/68     Pulse Rate 96     Resp      Temp 98.3 F (36.8 C)     Temp Source Oral     SpO2 99 %     Weight 142 lb 8 oz (64.6 kg)     Height 5\' 7"  (1.702 m)     Head Circumference      Peak Flow      Pain Score 0     Pain Loc      Pain Edu?      Excl. in GC?    No data found.  Updated Vital Signs BP 96/68 (BP Location: Right Arm)   Pulse 96   Temp 98.3 F (36.8 C) (Oral)   Ht 5\' 7"  (1.702 m)   Wt 142 lb 8 oz (64.6 kg)   LMP 10/29/2017 (Exact Date)   SpO2 99%   BMI  22.32 kg/m   Visual Acuity Right Eye Distance:   Left Eye Distance:   Bilateral Distance:    Right Eye Near:   Left Eye Near:    Bilateral Near:     Physical Exam  Constitutional: She appears well-developed and well-nourished. No distress.  HENT:  Head: Normocephalic and atraumatic.  Eyes: Conjunctivae are normal.  Neck: Neck supple.  Cardiovascular: Normal rate and regular rhythm.  No murmur heard. Pulmonary/Chest: Effort normal and breath sounds normal. No respiratory distress.  Abdominal: Soft. There is no tenderness.  Musculoskeletal: She exhibits no edema.  Neurological: She is alert.  Skin: Skin is warm and dry.  Psychiatric: She has a normal mood and affect.  Nursing note and vitals reviewed.    UC Treatments / Results  Labs (all labs ordered are listed, but only abnormal results are displayed) Labs Reviewed  POCT INFLUENZA A/B    EKG  EKG Interpretation None       Radiology No results found.  Procedures Procedures (including critical care time)  Medications Ordered in UC Medications - No  data to display   Initial Impression / Assessment and Plan / UC Course  I have reviewed the triage vital signs and the nursing notes.  Pertinent labs & imaging results that were available during my care of the patient were reviewed by me and considered in my medical decision making (see chart for details).     MDM:  Pt symptoms consistent with influenza.  Pt counseled on treatment.  I will given tamiflu.  Final Clinical Impressions(s) / UC Diagnoses   Final diagnoses:  Influenza-like illness    ED Discharge Orders        Ordered    oseltamivir (TAMIFLU) 75 MG capsule  Every 12 hours     11/26/17 1411      An After Visit Summary was printed and given to the patient.  Controlled Substance Prescriptions [place] Controlled Substance Registry consulted? Not Applicable   Elson AreasSofia, Leslie K, [place]PA-C 11/27/17 1117

## 2018-08-06 IMAGING — DX DG CHEST 2V
2 series · 2 of 2 positions shown · non-contrast
Comparison: None.

CLINICAL DATA: Cough and chest tightness.  Fever

EXAM:
CHEST  2 VIEW

[chest pa]
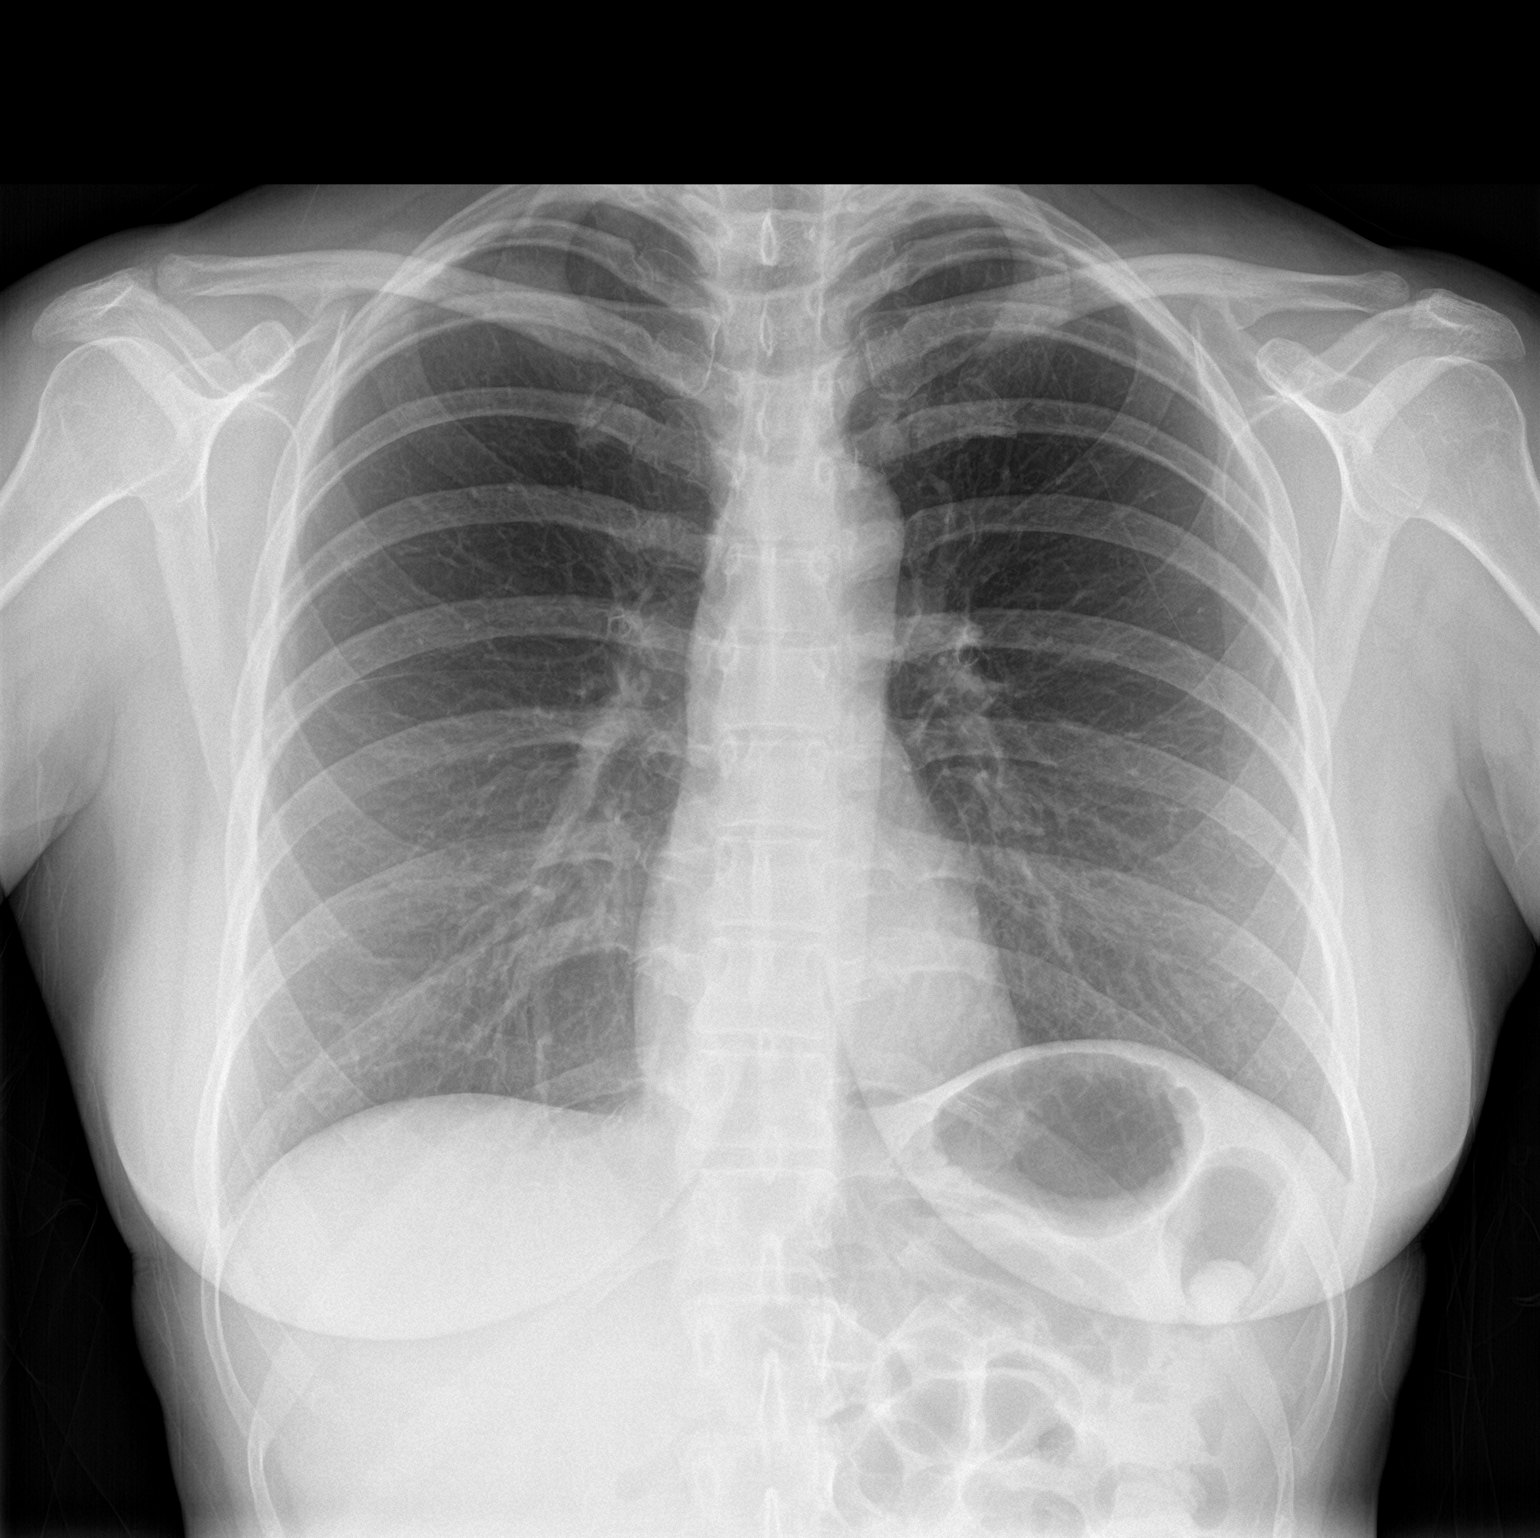

[chest lat]
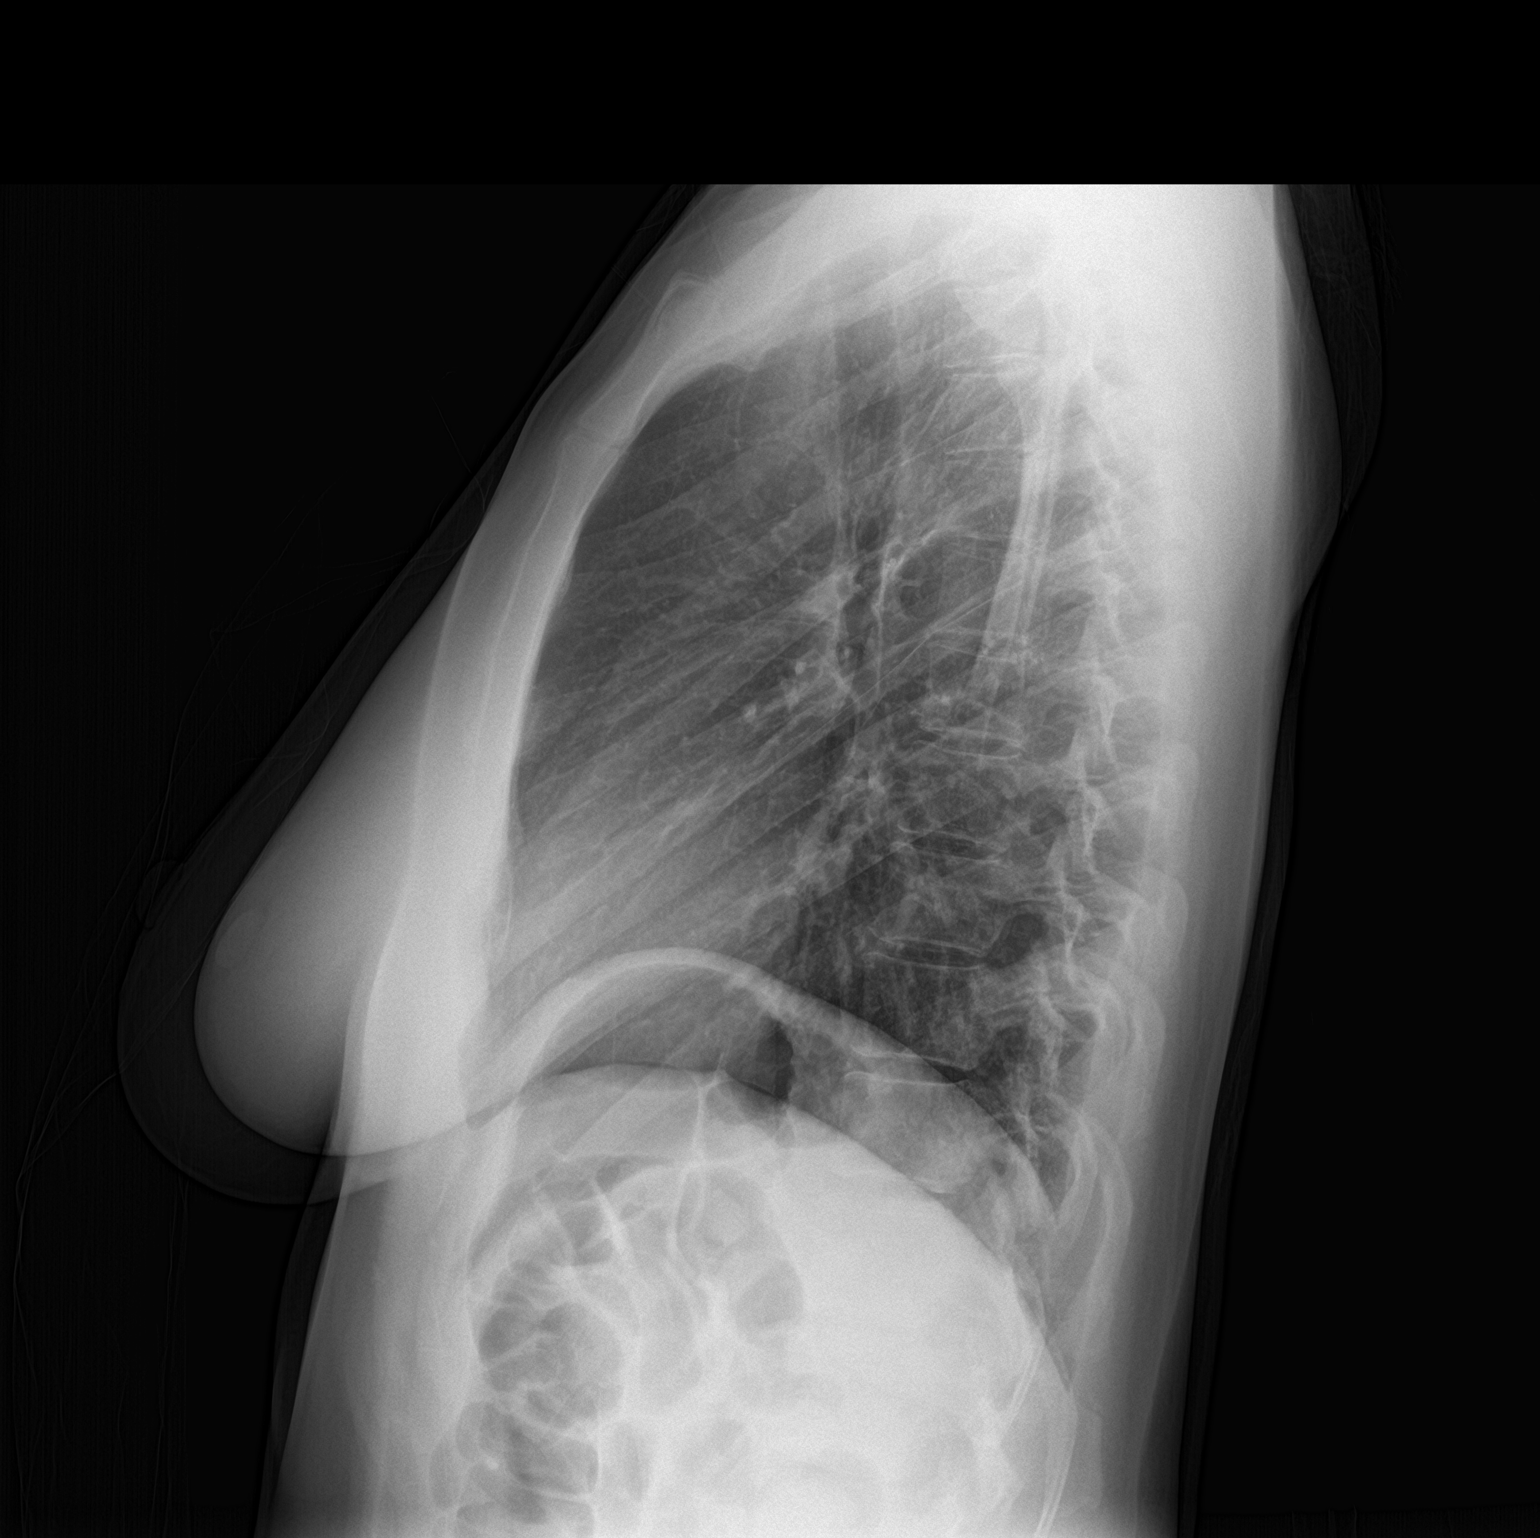

[2 of 2 positions shown; findings below may reference images not displayed]

FINDINGS: The heart size and mediastinal contours are within normal limits.
Both lungs are clear. The visualized skeletal structures are
unremarkable.
IMPRESSION: No active cardiopulmonary disease.
